# Patient Record
Sex: Male | Born: 1950 | Race: White | Hispanic: No | Marital: Married
Health system: Southern US, Community
[De-identification: ages and names within clinical notes are randomized; demographics above are authoritative.]

---

## 2018-07-16 ENCOUNTER — Ambulatory Visit (INDEPENDENT_AMBULATORY_CARE_PROVIDER_SITE_OTHER): Payer: Medicare Other | Admitting: Orthopaedic Surgery

## 2018-07-16 ENCOUNTER — Ambulatory Visit (INDEPENDENT_AMBULATORY_CARE_PROVIDER_SITE_OTHER): Payer: Self-pay

## 2018-07-16 ENCOUNTER — Ambulatory Visit (INDEPENDENT_AMBULATORY_CARE_PROVIDER_SITE_OTHER): Payer: Medicare Other

## 2018-07-16 DIAGNOSIS — M25562 Pain in left knee: Secondary | ICD-10-CM

## 2018-07-16 DIAGNOSIS — M25561 Pain in right knee: Secondary | ICD-10-CM

## 2018-07-16 DIAGNOSIS — G8929 Other chronic pain: Secondary | ICD-10-CM

## 2018-07-16 MED ORDER — METHYLPREDNISOLONE ACETATE 40 MG/ML IJ SUSP
40.0000 mg | INTRAMUSCULAR | Status: AC | PRN
  Administered 2018-07-16: 40 mg via INTRA_ARTICULAR

## 2018-07-16 MED ORDER — LIDOCAINE HCL 1 % IJ SOLN
2.0000 mL | INTRAMUSCULAR | Status: AC | PRN
  Administered 2018-07-16: 2 mL

## 2018-07-16 MED ORDER — BUPIVACAINE HCL 0.5 % IJ SOLN
2.0000 mL | INTRAMUSCULAR | Status: AC | PRN
  Administered 2018-07-16: 2 mL via INTRA_ARTICULAR

## 2018-07-16 NOTE — Progress Notes (Signed)
Office Visit Note   Patient: Brad StarrSteve Welch           Date of Birth: January 20, 1951           MRN: [id number] Visit Date: 07/16/2018              Requested by: No referring provider defined for this encounter. PCP: Patient, No Pcp Per   Assessment & Plan: Visit Diagnoses:  1. Chronic pain of both knees     Plan: Impression is Brad Welch is a 67 year old gentleman with left greater than right osteoarthritis that has not responded to conservative treatment in terms of cortisone injection and Visco supplementation that has been done at [facility].  I suspect that his arthritis may be worse than what the x-rays depict.  Therefore I would like to get an MRI of both knees to evaluate for structural abnormalities and to get a true sense of his arthritis.  We did perform an aspiration and injection of his left knee today.  I will see him back after the MRI.  Follow-Up Instructions: Return in about 2 weeks (around 07/30/2018).   Orders:  Orders Placed This Encounter  Procedures  . Large Joint Inj: L knee  . XR Knee 1-2 Views Left  . XR Knee 1-2 Views Right  . MR Knee Right w/o contrast  . MR Knee Left w/o contrast   No orders of the defined types were placed in this encounter.     Procedures: Large Joint Inj: L knee on 07/16/2018 6:25 PM Details: 22 G needle Medications: 2 mL bupivacaine 0.5 %; 2 mL lidocaine 1 %; 40 mg methylPREDNISolone acetate 40 MG/ML Aspirate: 12 mL blood-tinged Outcome: tolerated well, no immediate complications Patient was prepped and draped in the usual sterile fashion.       Clinical Data: No additional findings.   Subjective: Chief Complaint  Patient presents with  . Right Knee - Pain  . Left Knee - Pain    Brad Welch is a very pleasant 67 year old male who comes in with bilateral knee pain worse on the left.  He endorses swelling and chronic pain that is worse with ambulation and ascending stairs.  He denies any injuries.  He denies any constitutional  symptoms.  The pain is affecting his ability to sleep.  Denies any numbness and tingling.   Review of Systems  Constitutional: Negative.   All other systems reviewed and are negative.    Objective: Vital Signs: There were no vitals taken for this visit.  Physical Exam  Constitutional: He is oriented to person, place, and time. He appears well-developed and well-nourished.  HENT:  Head: Normocephalic and atraumatic.  Eyes: Pupils are equal, round, and reactive to light.  Neck: Neck supple.  Pulmonary/Chest: Effort normal.  Abdominal: Soft.  Musculoskeletal: Normal range of motion.  Neurological: He is alert and oriented to person, place, and time.  Skin: Skin is warm.  Psychiatric: He has a normal mood and affect. His behavior is normal. Judgment and thought content normal.  Nursing note and vitals reviewed.   Ortho Exam Left knee exam shows a medium effusion.  There is no warmth or evidence of infection.  Normal range of motion with pain at terminal flexion.  Collaterals and cruciates are stable. Specialty Comments:  No specialty comments available.  Imaging: Xr Knee 1-2 Views Left  Result Date: 07/16/2018 Mild osteoarthritis without significant joint space narrowing  Xr Knee 1-2 Views Right  Result Date: 07/16/2018 Mild osteoarthritis without significant joint space narrowing  PMFS History: There are no active problems to display for this patient.  No past medical history on file.  No family history on file.   Social History   Occupational History  . Not on file  Tobacco Use  . Smoking status: Not on file  Substance and Sexual Activity  . Alcohol use: Not on file  . Drug use: Not on file  . Sexual activity: Not on file

## 2018-07-28 ENCOUNTER — Telehealth (INDEPENDENT_AMBULATORY_CARE_PROVIDER_SITE_OTHER): Payer: Self-pay | Admitting: Radiology

## 2018-07-28 NOTE — Telephone Encounter (Signed)
[facility] for patient to call back and schedule an appointment, MRI Review with Dr. Roda Shutters after 08/01/18

## 2018-07-31 ENCOUNTER — Ambulatory Visit
Admission: RE | Admit: 2018-07-31 | Discharge: 2018-07-31 | Disposition: A | Discharge status: Home / Self Care | Payer: Medicare Other | Source: Ambulatory Visit | Attending: Orthopaedic Surgery | Admitting: Orthopaedic Surgery

## 2018-07-31 DIAGNOSIS — M25561 Pain in right knee: Principal | ICD-10-CM

## 2018-07-31 DIAGNOSIS — G8929 Other chronic pain: Secondary | ICD-10-CM

## 2018-07-31 DIAGNOSIS — M25562 Pain in left knee: Principal | ICD-10-CM

## 2018-08-01 ENCOUNTER — Other Ambulatory Visit: Payer: Self-pay

## 2018-08-05 ENCOUNTER — Ambulatory Visit (INDEPENDENT_AMBULATORY_CARE_PROVIDER_SITE_OTHER): Payer: Medicare Other | Admitting: Orthopaedic Surgery

## 2018-08-05 ENCOUNTER — Encounter (INDEPENDENT_AMBULATORY_CARE_PROVIDER_SITE_OTHER): Payer: Self-pay | Admitting: Orthopaedic Surgery

## 2018-08-05 DIAGNOSIS — M1712 Unilateral primary osteoarthritis, left knee: Secondary | ICD-10-CM

## 2018-08-05 DIAGNOSIS — M1711 Unilateral primary osteoarthritis, right knee: Secondary | ICD-10-CM | POA: Diagnosis not present

## 2018-08-05 MED ORDER — CELECOXIB 200 MG PO CAPS: 200.0000 mg | ORAL_CAPSULE | Freq: Two times a day (BID) | ORAL | 3 refills | Status: AC

## 2018-08-05 MED ORDER — DICLOFENAC SODIUM 1 % TD GEL: 2.0000 g | Freq: Four times a day (QID) | TRANSDERMAL | 5 refills | Status: AC

## 2018-08-05 NOTE — Progress Notes (Signed)
   Office Visit Note   Patient: Brad StarrSteve Welch           Date of Birth: 1951/10/12           MRN: [id number] Visit Date: 08/05/2018              Requested by: No referring provider defined for this encounter. PCP: Patient, No Pcp Per   Assessment & Plan: Visit Diagnoses:  1. Unilateral primary osteoarthritis, right knee   2. 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  No evidence of structural abnormalities.  Patient has had multiple injections at [facility] with minimal relief.  Based on x-rays he does not have any significant degenerative disease at this point.  We will prescribe him Voltaren gel and Celebrex to see if this will give him some relief.  He has knee braces.  I did recommend physical therapy and home exercise program.  Pamphlet on [employer] was provided today.  He will obtain records from [facility]. Total face to face encounter time was greater than 25 minutes and over half of this time was spent in counseling and/or coordination of care.  Follow-Up Instructions: Return if symptoms worsen or fail to improve.   Orders:  No orders of the defined types were placed in this encounter.  Meds ordered this encounter  Medications  . diclofenac sodium (VOLTAREN) 1 % GEL    Sig: Apply 2 g topically 4 (four) times daily.    Dispense:  1 Tube    Refill:  5  . celecoxib (CELEBREX) 200 MG capsule    Sig: Take 1 capsule (200 mg total) by mouth 2 (two) times daily.    Dispense:  60 capsule    Refill:  3      Procedures: No procedures performed   Clinical Data: No additional findings.   Subjective: Chief Complaint  Patient presents with  . Right Knee - Pain  . Left Knee - Pain    Brad CanalesSteve follows up today for bilateral knee MRI.  He continues to have pain with increased activity and with ascending stairs.  Denies any mechanical symptoms.   Review of  Systems   Objective: Vital Signs: There were no vitals taken for this visit.  Physical Exam  Ortho Exam Bilateral knee exam shows no joint effusion.  Collaterals and cruciates are stable.  Normal range of motion. Specialty Comments:  No specialty comments available.  Imaging: No results found.   PMFS History: There are no active problems to display for this patient.  History reviewed. No pertinent past medical history.  History reviewed. No pertinent family history.  History reviewed. No pertinent surgical history. Social History   Occupational History  . Not on file  Tobacco Use  . Smoking status: Not on file  Substance and Sexual Activity  . Alcohol use: Not on file  . Drug use: Not on file  . Sexual activity: Not on file

## 2019-08-25 ENCOUNTER — Ambulatory Visit (INDEPENDENT_AMBULATORY_CARE_PROVIDER_SITE_OTHER): Payer: Medicare Other | Admitting: Orthopaedic Surgery

## 2019-08-25 ENCOUNTER — Other Ambulatory Visit: Payer: Self-pay

## 2019-08-25 ENCOUNTER — Ambulatory Visit: Payer: Self-pay

## 2019-08-25 ENCOUNTER — Encounter: Payer: Self-pay | Admitting: Orthopaedic Surgery

## 2019-08-25 DIAGNOSIS — M1712 Unilateral primary osteoarthritis, left knee: Secondary | ICD-10-CM | POA: Diagnosis not present

## 2019-08-25 DIAGNOSIS — M25561 Pain in right knee: Secondary | ICD-10-CM

## 2019-08-25 DIAGNOSIS — M1711 Unilateral primary osteoarthritis, right knee: Secondary | ICD-10-CM

## 2019-08-25 DIAGNOSIS — G8929 Other chronic pain: Secondary | ICD-10-CM

## 2019-08-25 DIAGNOSIS — M25562 Pain in left knee: Secondary | ICD-10-CM

## 2019-08-25 MED ORDER — METHYLPREDNISOLONE ACETATE 40 MG/ML IJ SUSP
40.0000 mg | INTRAMUSCULAR | Status: AC | PRN
  Administered 2019-08-25: 40 mg via INTRA_ARTICULAR

## 2019-08-25 MED ORDER — BUPIVACAINE HCL 0.5 % IJ SOLN
2.0000 mL | INTRAMUSCULAR | Status: AC | PRN
  Administered 2019-08-25: 2 mL via INTRA_ARTICULAR

## 2019-08-25 MED ORDER — LIDOCAINE HCL 1 % IJ SOLN
2.0000 mL | INTRAMUSCULAR | Status: AC | PRN
  Administered 2019-08-25: 2 mL

## 2019-08-25 NOTE — Progress Notes (Signed)
Office Visit Note   Patient: Brad Welch           Date of Birth: 02/18/1951           MRN: [id number] Visit Date: 08/25/2019              Requested by: No referring provider defined for this encounter. PCP: Patient, No Pcp Per   Assessment & Plan: Visit Diagnoses:  1. Unilateral primary osteoarthritis, left knee   2. Chronic pain of both knees   3. Unilateral primary osteoarthritis, right knee     Plan: Impression is bilateral knee moderate osteoarthritis.  Cortisone injections performed for both knees today.  Home exercise program was mailed to the patient's home.  They are not interested in outpatient PT right now given the fact that he is on chemo right now.  Questions encouraged and answered.  Follow-up as needed.  Follow-Up Instructions: Return if symptoms worsen or fail to improve.   Orders:  Orders Placed This Encounter  Procedures  . XR KNEE 3 VIEW LEFT  . XR KNEE 3 VIEW RIGHT   No orders of the defined types were placed in this encounter.     Procedures: Large Joint Inj: bilateral knee on 08/25/2019 4:53 PM Indications: pain Details: 22 G needle  Arthrogram: No  Medications (Right): 2 mL lidocaine 1 %; 2 mL bupivacaine 0.5 %; 40 mg methylPREDNISolone acetate 40 MG/ML Medications (Left): 2 mL lidocaine 1 %; 2 mL bupivacaine 0.5 %; 40 mg methylPREDNISolone acetate 40 MG/ML Outcome: tolerated well, no immediate complications Patient was prepped and draped in the usual sterile fashion.       Clinical Data: No additional findings.   Subjective: Chief Complaint  Patient presents with  . Left Knee - Pain  . Right Knee - Pain    Brad Welch is a 68 year old gentleman comes in for evaluation of continued bilateral knee pain worse on the right.  Unfortunately he is currently undergoing chemotherapy for metastatic lung cancer.  This has significantly sapped his energy.  He is unable to take NSAIDs due to interaction with chemo.   Review of Systems   Constitutional: Negative.   All other systems reviewed and are negative.    Objective: Vital Signs: There were no vitals taken for this visit.  Physical Exam Vitals signs and nursing note reviewed.  Constitutional:      Appearance: He is well-developed.  Pulmonary:     Effort: Pulmonary effort is normal.  Abdominal:     Palpations: Abdomen is soft.  Skin:    General: Skin is warm.  Neurological:     Mental Status: He is alert and oriented to person, place, and time.  Psychiatric:        Behavior: Behavior normal.        Thought Content: Thought content normal.        Judgment: Judgment normal.     Ortho Exam Bilateral knee exam shows no joint effusion.  Patellofemoral crepitus.  Collaterals and cruciates are stable. Specialty Comments:  No specialty comments available.  Imaging: Xr Knee 3 View Left  Result Date: 08/25/2019 Moderate DJD  Xr Knee 3 View Right  Result Date: 08/25/2019 Moderate DJD    PMFS History: Patient Active Problem List   Diagnosis Date Noted  . Unilateral primary osteoarthritis, left knee 08/25/2019  . Unilateral primary osteoarthritis, right knee 08/25/2019   History reviewed. No pertinent past medical history.  History reviewed. No pertinent family history.  History reviewed. No pertinent surgical  history. Social History   Occupational History  . Not on file  Tobacco Use  . Smoking status: Not on file  Substance and Sexual Activity  . Alcohol use: Not on file  . Drug use: Not on file  . Sexual activity: Not on file

## 2019-10-29 IMAGING — MR MR KNEE*L* W/O CM
4 of 7 series · 22 of 40 positions shown · non-contrast
Comparison: None.

CLINICAL DATA: Left knee pain.

EXAM:
MRI OF THE LEFT KNEE WITHOUT CONTRAST
TECHNIQUE: Multiplanar, multisequence MR imaging of the knee was performed. No
intravenous contrast was administered.

[Series 3: T2 fat-sat · axial · 4.0mm · 0.50mm/px · z∈[-82,+33]mm · 5 of 24 slices shown]
[im 1/24]
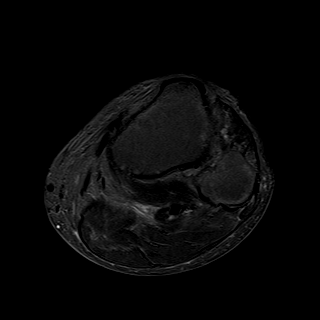
[im 5/24]
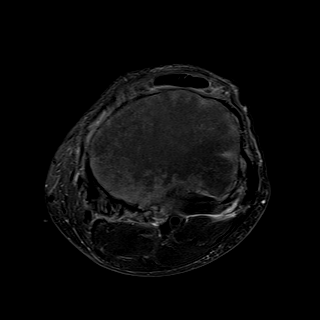
[im 10/24]
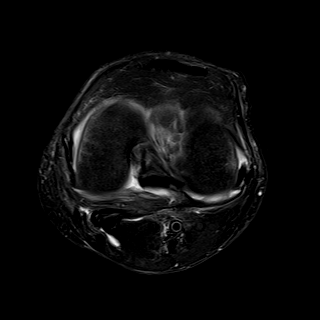
[im 14/24]
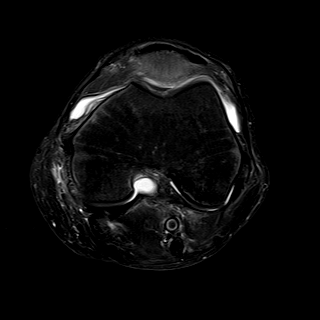
[im 24/24]
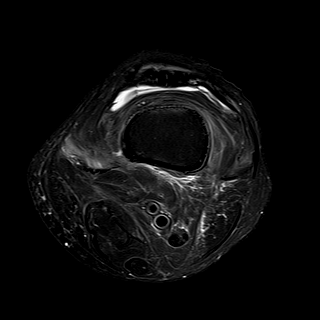

[Series 7: PD fat-sat · sagittal · 3.0mm · 0.29mm/px · 7 of 27 slices shown (1 of 3)]
[im 1/27]
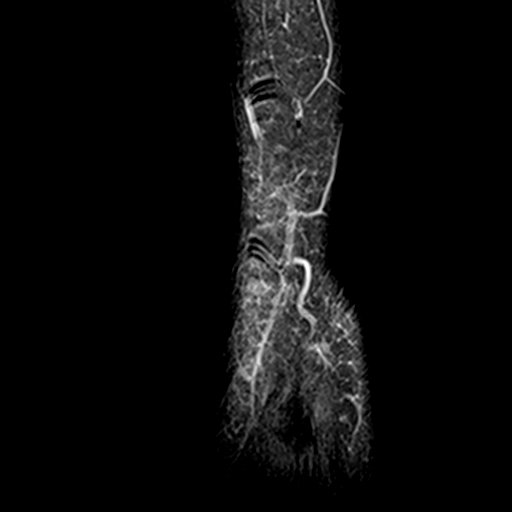
[im 5/27]
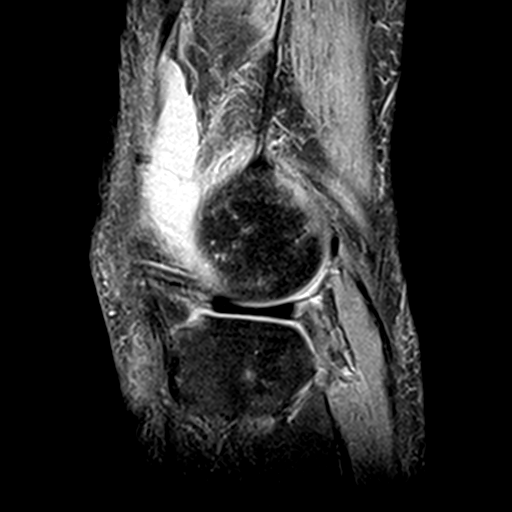
[im 9/27]
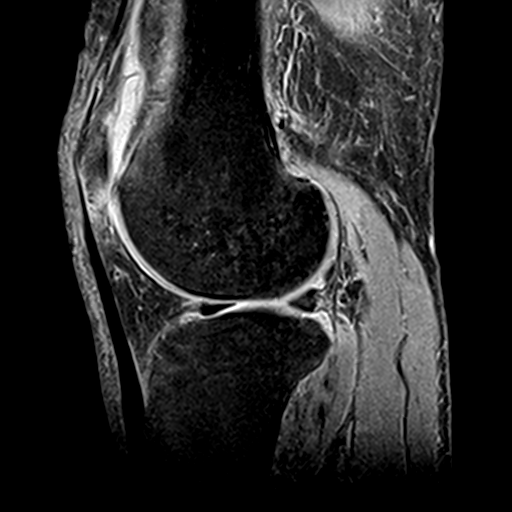
[im 14/27]
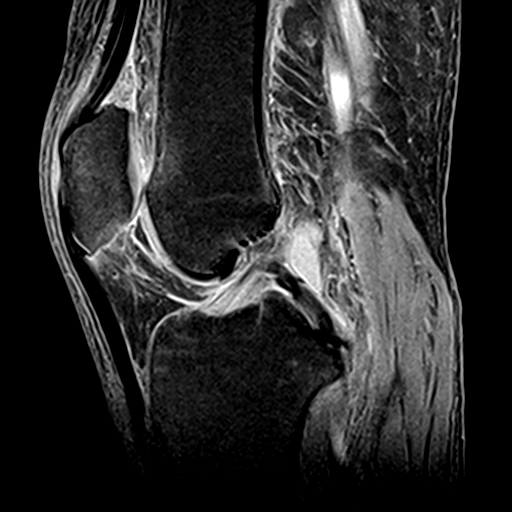
[im 18/27]
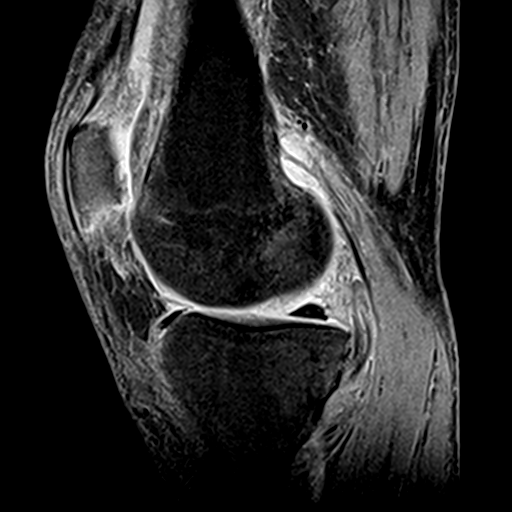
[im 22/27]
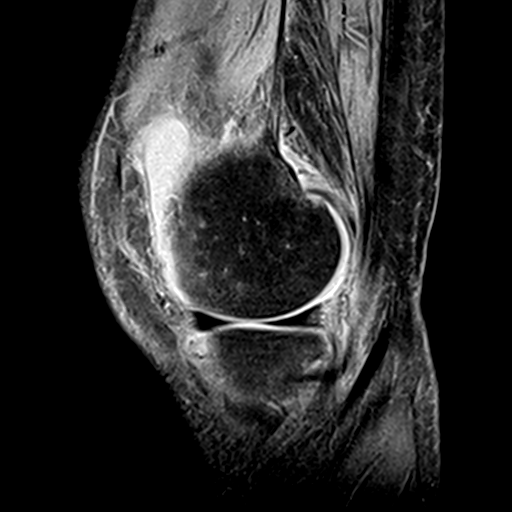
[im 27/27]
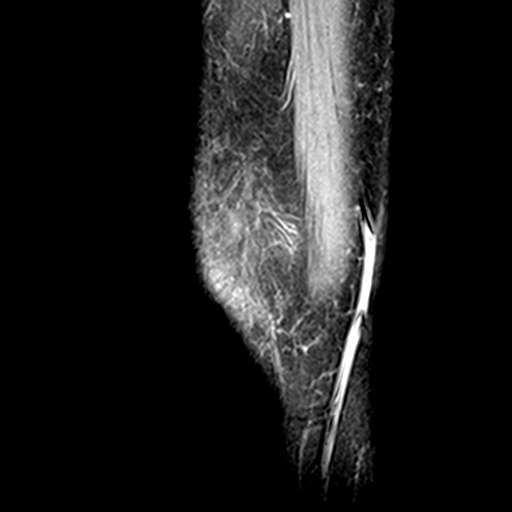

[Series 8: PD fat-sat · coronal · 3.0mm · 0.29mm/px · 7 of 28 slices shown (2 of 3)]
[im 1/28]
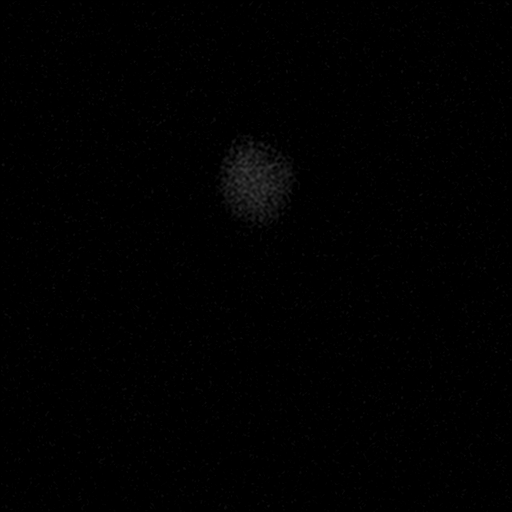
[im 5/28]
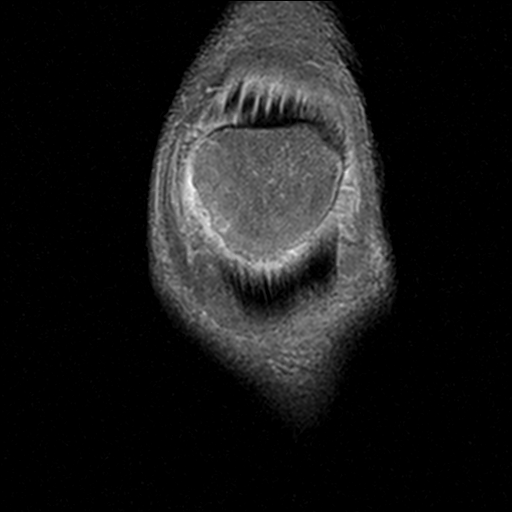
[im 10/28]
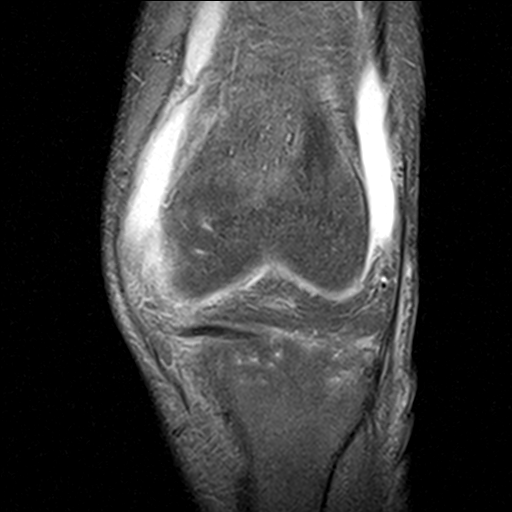
[im 14/28]
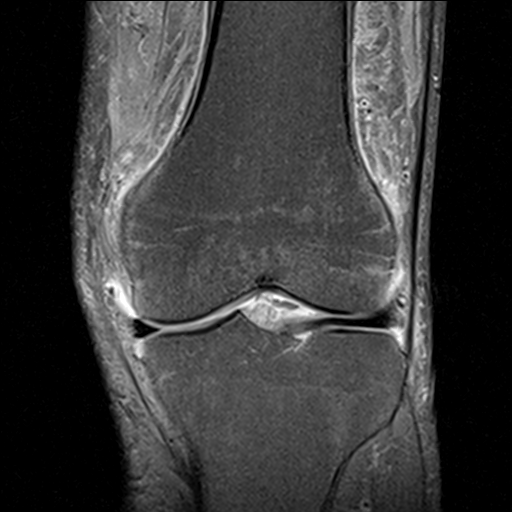
[im 19/28]
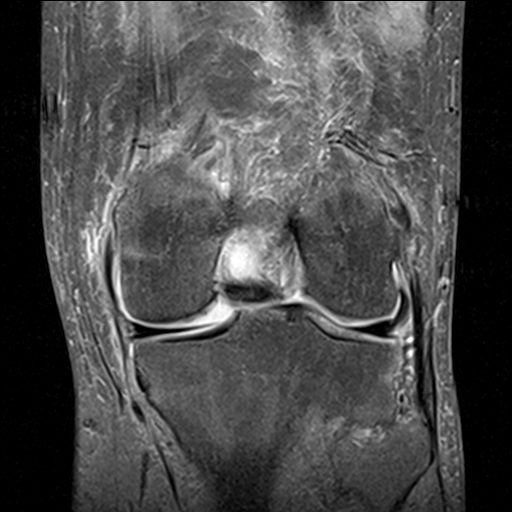
[im 23/28]
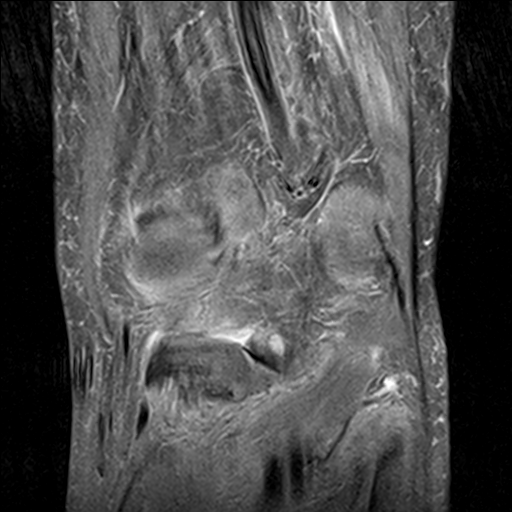
[im 28/28]
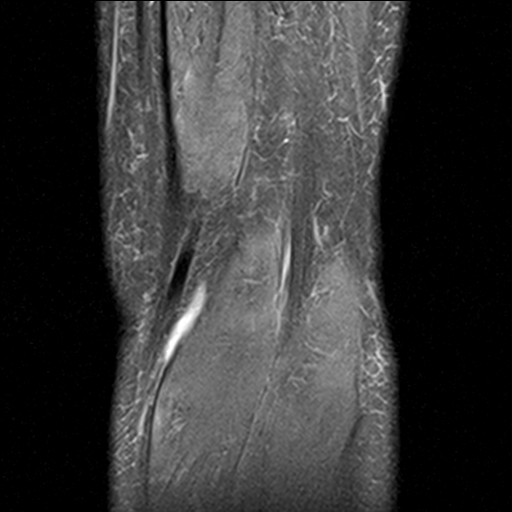

[Series 9: PD fat-sat · oblique · 2.0mm · 0.29mm/px · 3 of 11 slices shown (3 of 3)]
[im 1/11]
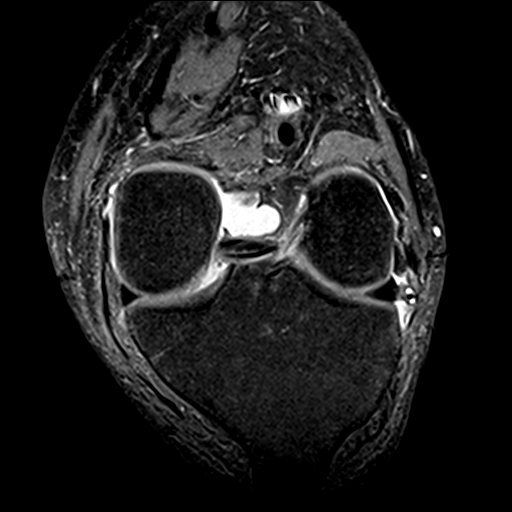
[im 6/11]
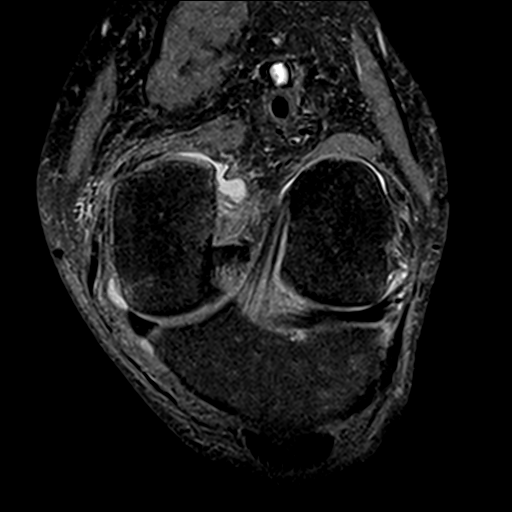
[im 11/11]
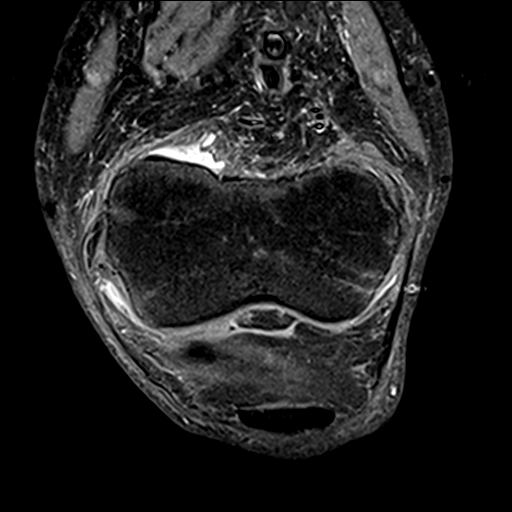

[22 of 40 positions shown; findings below may reference images not displayed]

FINDINGS: MENISCI

Medial meniscus:  Intact.

Lateral meniscus:  Intact.

LIGAMENTS

Cruciates:  Intact.  Mucoid degeneration of the ACL noted.

Collaterals:  Intact.  Mild MCL and pes anserine bursitis.

CARTILAGE

Patellofemoral:  Mild degenerative chondrosis.

Medial: Mild to moderate degenerative chondrosis with early joint
space narrowing and spurring.

Lateral: Moderate degenerative chondrosis with mild joint space
narrowing.

Joint: Small to moderate-sized joint effusion and mild synovitis.
Moderate thickening and edema of the quadriceps fat pad.

Popliteal Fossa:  Small Baker's cyst.

Extensor Mechanism: The patella retinacular structures are intact
and the quadriceps and patellar tendons are intact.

Bones:  No acute bony findings.  No osteochondral abnormality.

Other: Nonspecific mild edema like signal abnormality and some of
the knee muscles as seen on the other knee. Nonspecific finding but
could reflect myositis.
IMPRESSION: 1. Intact ligamentous structures and no acute bony findings.
2. No meniscal tears.
3. Mild to moderate tricompartmental degenerative changes.
4. Small to moderate-sized joint effusion and small Baker's cyst.
5. Mild nonspecific myositis.

## 2020-01-21 ENCOUNTER — Ambulatory Visit: Payer: Federal, State, Local not specified - PPO | Attending: Internal Medicine

## 2021-02-17 DEATH — deceased
